# Patient Record
Sex: Male | Born: 1960 | Marital: Married | Smoking: Never smoker
Health system: Southern US, Community
[De-identification: ages and names within clinical notes are randomized; demographics above are authoritative.]

---

## 2012-01-14 ENCOUNTER — Other Ambulatory Visit: Payer: Self-pay | Admitting: Specialist

## 2012-01-14 ENCOUNTER — Ambulatory Visit
Admission: RE | Admit: 2012-01-14 | Discharge: 2012-01-14 | Disposition: A | Discharge status: Home / Self Care | Payer: Self-pay | Source: Ambulatory Visit | Attending: Specialist | Admitting: Specialist

## 2012-01-14 DIAGNOSIS — R6889 Other general symptoms and signs: Secondary | ICD-10-CM

## 2019-08-05 ENCOUNTER — Other Ambulatory Visit: Payer: Self-pay

## 2019-08-05 ENCOUNTER — Emergency Department (HOSPITAL_COMMUNITY): Payer: Self-pay

## 2019-08-05 ENCOUNTER — Emergency Department (HOSPITAL_COMMUNITY)
Admission: EM | Admit: 2019-08-05 | Discharge: 2019-08-05 | Disposition: A | Discharge status: Home / Self Care | Payer: Self-pay | Attending: Emergency Medicine | Admitting: Emergency Medicine

## 2019-08-05 ENCOUNTER — Encounter (HOSPITAL_COMMUNITY): Payer: Self-pay | Admitting: Emergency Medicine

## 2019-08-05 DIAGNOSIS — H81399 Other peripheral vertigo, unspecified ear: Secondary | ICD-10-CM | POA: Insufficient documentation

## 2019-08-05 LAB — BASIC METABOLIC PANEL
Anion gap: 13 (ref 5–15)
BUN: 13 mg/dL (ref 6–20)
CO2: 22 mmol/L (ref 22–32)
Calcium: 9.7 mg/dL (ref 8.9–10.3)
Chloride: 103 mmol/L (ref 98–111)
Creatinine, Ser: 1.01 mg/dL (ref 0.61–1.24)
GFR calc Af Amer: >60 mL/min (ref >60)
GFR calc non Af Amer: >60 mL/min (ref >60)
Glucose, Bld: 124 mg/dL — ABNORMAL HIGH (ref 70–99)
Potassium: 3.5 mmol/L (ref 3.5–5.1)
Sodium: 138 mmol/L (ref 135–145)

## 2019-08-05 LAB — TROPONIN I (HIGH SENSITIVITY)
Troponin I (High Sensitivity): 4 ng/L (ref <18)
Troponin I (High Sensitivity): 4 ng/L (ref <18)

## 2019-08-05 LAB — CBC
HCT: 47.5 % (ref 39.0–52.0)
Hemoglobin: 16.1 g/dL (ref 13.0–17.0)
MCH: 29.7 pg (ref 26.0–34.0)
MCHC: 33.9 g/dL (ref 30.0–36.0)
MCV: 87.6 fL (ref 80.0–100.0)
Platelets: 327 10*3/uL (ref 150–400)
RBC: 5.42 MIL/uL (ref 4.22–5.81)
RDW: 13.1 % (ref 11.5–15.5)
WBC: 7.6 10*3/uL (ref 4.0–10.5)
nRBC: 0 % (ref 0.0–0.2)

## 2019-08-05 LAB — URINALYSIS, ROUTINE W REFLEX MICROSCOPIC
Bilirubin Urine: NEGATIVE
Glucose, UA: NEGATIVE mg/dL
Ketones, ur: NEGATIVE mg/dL
Leukocytes,Ua: NEGATIVE
Nitrite: NEGATIVE
Protein, ur: NEGATIVE mg/dL
Specific Gravity, Urine: 1.01 (ref 1.005–1.030)
pH: 7 (ref 5.0–8.0)

## 2019-08-05 LAB — URINALYSIS, MICROSCOPIC (REFLEX)
Bacteria, UA: NONE SEEN
RBC / HPF: NONE SEEN RBC/hpf (ref 0–5)
Squamous Epithelial / HPF: NONE SEEN (ref 0–5)
WBC, UA: NONE SEEN WBC/hpf (ref 0–5)

## 2019-08-05 MED ORDER — MECLIZINE HCL 25 MG PO TABS
25.0000 mg | ORAL_TABLET | Freq: Once | ORAL | Status: AC
  Administered 2019-08-05: 25 mg via ORAL
  Filled 2019-08-05: qty 1

## 2019-08-05 MED ORDER — SODIUM CHLORIDE 0.9% FLUSH: 3.0000 mL | Freq: Once | INTRAVENOUS | Status: DC

## 2019-08-05 MED ORDER — MECLIZINE HCL 25 MG PO TABS: 25.0000 mg | ORAL_TABLET | Freq: Three times a day (TID) | ORAL | 0 refills | Status: AC | PRN

## 2019-08-05 NOTE — ED Notes (Addendum)
Pt able to ambulate unassisted, pt denies feeling dizzy or lightheaded. 98% RA while ambulating.

## 2019-08-05 NOTE — ED Notes (Signed)
Patient transported to CT 

## 2019-08-05 NOTE — ED Triage Notes (Signed)
Patient here from home with complaints of dizziness that started today. Ambulatory with assistance. Reports that it feels like the "room is spinning".

## 2019-08-05 NOTE — Discharge Instructions (Signed)
Take the prescription as directed.  Call your regular medical doctor today to schedule a follow up appointment within the next 3 days.  Call the Neurologist today to schedule a follow up appointment within the next week. Return to the Emergency Department immediately sooner if worsening.  ° °

## 2019-08-05 NOTE — ED Notes (Signed)
An After Visit Summary was printed and given to the patient. Discharge instructions given and no further questions at this time.  Pt leaving with son.  

## 2019-08-05 NOTE — ED Provider Notes (Signed)
[facility]-EMERGENCY DEPT Provider Note   CSN: [id number] Arrival date & time: 08/05/19  0404     History   Chief Complaint Chief Complaint  Patient presents with   Dizziness    HPI John Atkins is a 58 y.o. male.     HPI  Pt was seen at 0735.  Per pt and his family, c/o gradual onset and persistence of constant "dizziness" for the past 2 days. Pt describes the dizziness as "everything is spinning around." Worsens with moving his head side to side. Denies any other symptoms. Denies visual changes, no focal motor weakness, no tingling/numbness in extremities, no ataxia, no slurred speech, no facial droop. Denies CP/palpitations, no SOB/cough, no abd pain, no N/V/D, no neck pain, no rash, no injury, no fevers.    History reviewed. No pertinent past medical history.  There are no active problems to display for this patient.   History reviewed. No pertinent surgical history.      Home Medications    Prior to Admission medications   Not on File    Family History No family history on file.  Social History Social History   Tobacco Use   Smoking status: Never Smoker   Smokeless tobacco: Never Used  Substance Use Topics   Alcohol use: Never    Frequency: Never   Drug use: Never     Allergies   Patient has no allergy information on record.   Review of Systems Review of Systems ROS: Statement: All systems negative except as marked or noted in the HPI; Constitutional: Negative for fever and chills. ; ; Eyes: Negative for eye pain, redness and discharge. ; ; ENMT: Negative for ear pain, hoarseness, nasal congestion, sinus pressure and sore throat. ; ; Cardiovascular: Negative for chest pain, palpitations, diaphoresis, dyspnea and peripheral edema. ; ; Respiratory: Negative for cough, wheezing and stridor. ; ; Gastrointestinal: Negative for nausea, vomiting, diarrhea, abdominal pain, blood in stool, hematemesis, jaundice and rectal bleeding.  . ; ; Genitourinary: Negative for dysuria, flank pain and hematuria. ; ; Musculoskeletal: Negative for back pain and neck pain. Negative for swelling and trauma.; ; Skin: Negative for pruritus, rash, abrasions, blisters, bruising and skin lesion.; ; Neuro: +"everything is spinning." Negative for headache, lightheadedness and neck stiffness. Negative for weakness, altered level of consciousness, altered mental status, extremity weakness, paresthesias, involuntary movement, seizure and syncope.       Physical Exam Updated Vital Signs BP (!) 208/88 (BP Location: Left Arm)    Pulse 82    Temp 98.4 F (36.9 C) (Oral)    Resp 17    SpO2 100%    Patient Vitals for the past 24 hrs:  BP Temp Temp src Pulse Resp SpO2  08/05/19 1200 (!) 155/95 -- -- 78 15 98 %  08/05/19 1130 (!) 177/99 -- -- 96 (!) 25 99 %  08/05/19 1100 (!) 166/81 -- -- 86 19 98 %  08/05/19 1030 (!) 171/98 -- -- 89 17 98 %  08/05/19 1015 -- -- -- 86 16 98 %  08/05/19 1001 (!) 192/87 -- -- 85 16 98 %  08/05/19 1000 (!) 192/87 -- -- 74 13 97 %  08/05/19 0843 (!) 191/90 -- -- 87 16 98 %  08/05/19 0559 (!) 208/88 -- -- 82 17 100 %  08/05/19 0558 (!) 194/89 98.4 F (36.9 C) Oral 85 (!) 21 100 %     10:21 Orthostatic Vital Signs JS  Orthostatic Sitting  BP- Sitting: 192/87  Pulse-  Sitting: 79      Orthostatic Standing at 0 minutes  BP- Standing at 0 minutes: 199/91Abnormal   Pulse- Standing at 0 minutes: 80     Physical Exam 0740: Physical examination:  Nursing notes reviewed; Vital signs and O2 SAT reviewed;  Constitutional: Well developed, Well nourished, Well hydrated, In no acute distress; Head:  Normocephalic, atraumatic; Eyes: EOMI, PERRL, No scleral icterus; ENMT: Clear fluid behind TM's bilat,no erythema or purulence. +edemetous nasal turbinates bilat with clear rhinorrhea. Mouth and pharynx normal, Mucous membranes moist; Neck: Supple, Full range of motion, No lymphadenopathy; Cardiovascular: Regular rate and rhythm,  No gallop; Respiratory: Breath sounds clear & equal bilaterally, No wheezes.  Speaking full sentences with ease, Normal respiratory effort/excursion; Chest: Nontender, Movement normal; Abdomen: Soft, Nontender, Nondistended, Normal bowel sounds; Genitourinary: No CVA tenderness; Extremities: Peripheral pulses normal, No tenderness, No edema, No calf edema or asymmetry.; Neuro: AA&Ox3, Major CN grossly intact. Speech clear.  No facial droop. +right horizontal end gaze fatigable nystagmus. Grips equal. Strength 5/5 equal bilat UE's and LE's.  DTR 2/4 equal bilat UE's and LE's.  No gross sensory deficits.  Normal cerebellar testing bilat UE's (finger-nose) and LE's (heel-shin)..; Skin: Color normal, Warm, Dry.   ED Treatments / Results  Labs (all labs ordered are listed, but only abnormal results are displayed)   EKG EKG Interpretation  Date/Time:  Thursday August 05 2019 05:58:06 EDT Ventricular Rate:  84 PR Interval:    QRS Duration: 84 QT Interval:  396 QTC Calculation: 469 R Axis:   17 Text Interpretation:  Sinus rhythm No old tracing to compare Confirmed by Pryor Curia [phone]) on 08/05/2019 6:02:32 AM   Radiology   Procedures Procedures (including critical care time)  Medications Ordered in ED Medications  sodium chloride flush (NS) 0.9 % injection 3 mL (has no administration in time range)  meclizine (ANTIVERT) tablet 25 mg (has no administration in time range)     Initial Impression / Assessment and Plan / ED Course  I have reviewed the triage vital signs and the nursing notes.  Pertinent labs & imaging results that were available during my care of the patient were reviewed by me and considered in my medical decision making (see chart for details).     MDM Reviewed: previous chart, nursing note and vitals Reviewed previous: labs Interpretation: labs and CT scan     Results for orders placed or performed during the hospital encounter of 60/45/40  Basic  metabolic panel  Result Value Ref Range   Sodium 138 135 - 145 mmol/L   Potassium 3.5 3.5 - 5.1 mmol/L   Chloride 103 98 - 111 mmol/L   CO2 22 22 - 32 mmol/L   Glucose, Bld 124 (H) 70 - 99 mg/dL   BUN 13 6 - 20 mg/dL   Creatinine, Ser 1.01 0.61 - 1.24 mg/dL   Calcium 9.7 8.9 - 10.3 mg/dL   GFR calc non Af Amer >60 >60 mL/min   GFR calc Af Amer >60 >60 mL/min   Anion gap 13 5 - 15  CBC  Result Value Ref Range   WBC 7.6 4.0 - 10.5 K/uL   RBC 5.42 4.22 - 5.81 MIL/uL   Hemoglobin 16.1 13.0 - 17.0 g/dL   HCT 47.5 39.0 - 52.0 %   MCV 87.6 80.0 - 100.0 fL   MCH 29.7 26.0 - 34.0 pg   MCHC 33.9 30.0 - 36.0 g/dL   RDW 13.1 11.5 - 15.5 %   Platelets 327 150 -  400 K/uL   nRBC 0.0 0.0 - 0.2 %  Urinalysis, Routine w reflex microscopic  Result Value Ref Range   Color, Urine STRAW (A) YELLOW   APPearance CLEAR CLEAR   Specific Gravity, Urine 1.010 1.005 - 1.030   pH 7.0 5.0 - 8.0   Glucose, UA NEGATIVE NEGATIVE mg/dL   Hgb urine dipstick TRACE (A) NEGATIVE   Bilirubin Urine NEGATIVE NEGATIVE   Ketones, ur NEGATIVE NEGATIVE mg/dL   Protein, ur NEGATIVE NEGATIVE mg/dL   Nitrite NEGATIVE NEGATIVE   Leukocytes,Ua NEGATIVE NEGATIVE  Urinalysis, Microscopic (reflex)  Result Value Ref Range   RBC / HPF NONE SEEN 0 - 5 RBC/hpf   WBC, UA NONE SEEN 0 - 5 WBC/hpf   Bacteria, UA NONE SEEN NONE SEEN   Squamous Epithelial / LPF NONE SEEN 0 - 5  Troponin I (High Sensitivity)  Result Value Ref Range   Troponin I (High Sensitivity) 4 <18 ng/L  Troponin I (High Sensitivity)  Result Value Ref Range   Troponin I (High Sensitivity) 4 <18 ng/L   Ct Head Wo Contrast Result Date: 08/05/2019 CLINICAL DATA:  Vertigo, dizziness EXAM: CT HEAD WITHOUT CONTRAST TECHNIQUE: Contiguous axial images were obtained from the base of the skull through the vertex without intravenous contrast. COMPARISON:  None. FINDINGS: Brain: No evidence of acute infarction, hemorrhage, hydrocephalus, extra-axial collection or mass  lesion/mass effect. Vascular: No hyperdense vessel or unexpected calcification. Skull: Normal. Negative for fracture or focal lesion. Sinuses/Orbits: No acute finding. Other: None. IMPRESSION: No acute intracranial findings. Electronically Signed   By: Duanne GuessNicholas  Plundo M.D.   On: 08/05/2019 08:35    John Atkins was evaluated in Emergency Department on 08/05/2019 for the symptoms described in the history of present illness. He was evaluated in the context of the global COVID-19 pandemic, which necessitated consideration that the patient might be at risk for infection with the SARS-CoV-2 virus that causes COVID-19. Institutional protocols and algorithms that pertain to the evaluation of patients at risk for COVID-19 are in a state of rapid change based on information released by regulatory bodies including the CDC and federal and state organizations. These policies and algorithms were followed during the patient's care in the ED.   1245:  Pt feels better after meds and wants to go home now. Pt has tol PO well without N/V. Pt not orthostatic on VS. Pt ambulated with steady gait, easy resps, NAD, Sats 98% R/A. Pt denies any further symptoms. Tx symptomatically at this time. Dx and testing, as well as incidental finding(s), d/w pt and family.  Questions answered.  Verb understanding, agreeable to d/c home with outpt f/u.    Final Clinical Impressions(s) / ED Diagnoses   Final diagnoses:  None    ED Discharge Orders    None       Samuel JesterMcManus, Dontarius Sheley, DO 08/09/19 1701

## 2021-01-12 IMAGING — CT CT HEAD W/O CM
3 series · 16 of 46 positions shown, 19 images · non-contrast
Comparison: None.

CLINICAL DATA: Vertigo, dizziness

EXAM:
CT HEAD WITHOUT CONTRAST
TECHNIQUE: Contiguous axial images were obtained from the base of the skull
through the vertex without intravenous contrast.

[Series 2: head wo · axial · 0.47mm/px · z∈[-107,+13]mm · 10 of 29 slices shown, 13 images]
[im 3/29  brain]
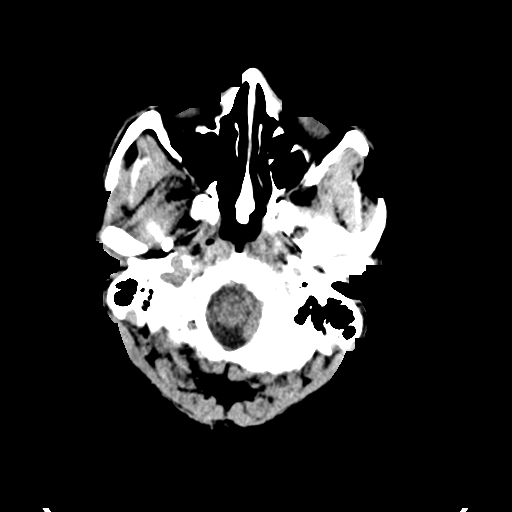
[im 3/29  bone]
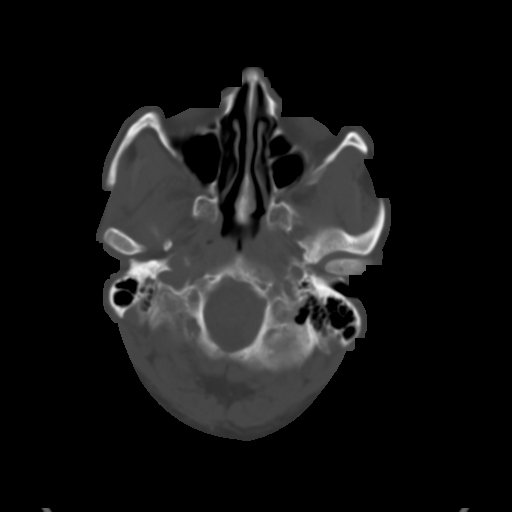
[im 6/29  brain]
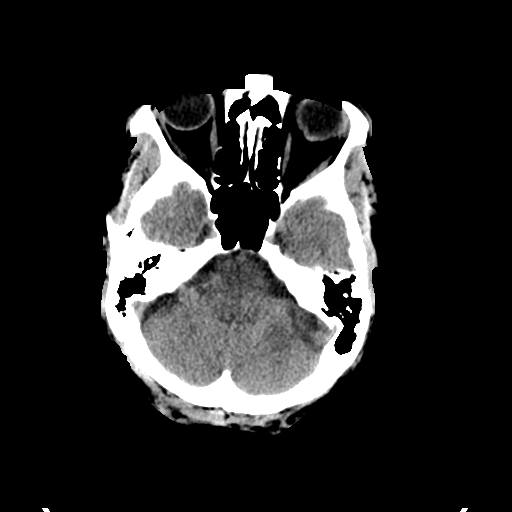
[im 8/29  brain]
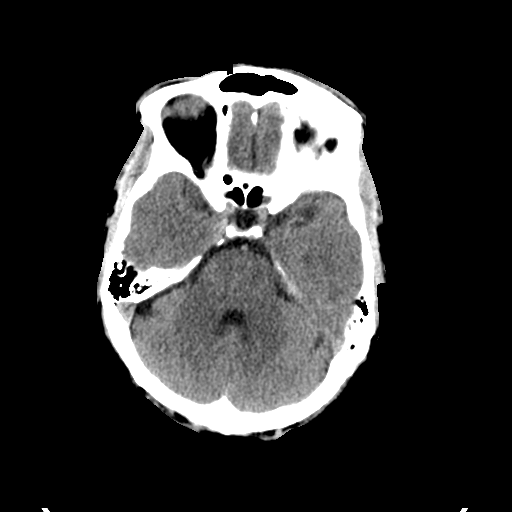
[im 11/29  brain]
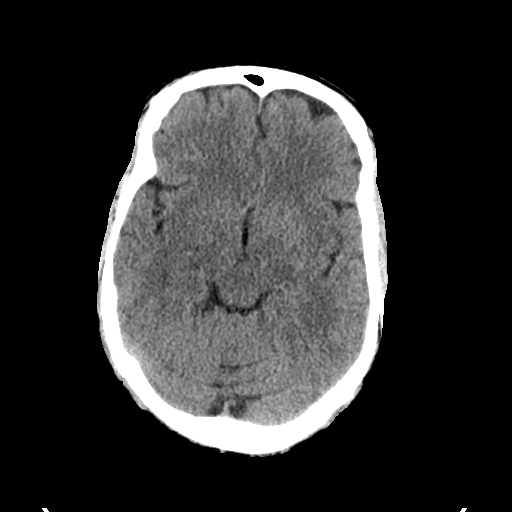
[im 14/29  brain]
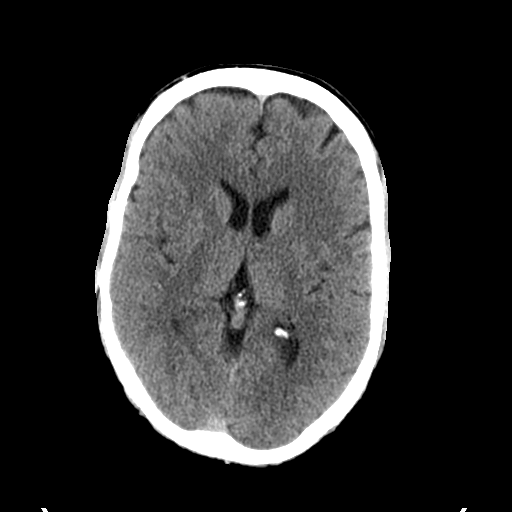
[im 14/29  bone]
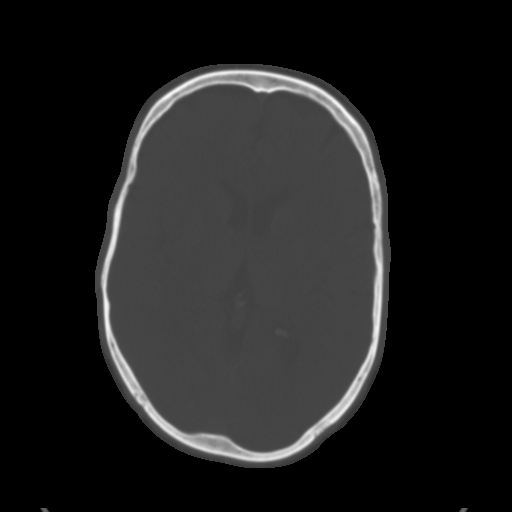
[im 16/29  brain]
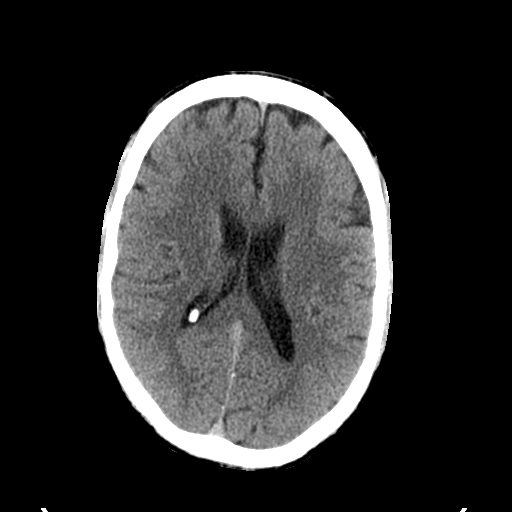
[im 19/29  brain]
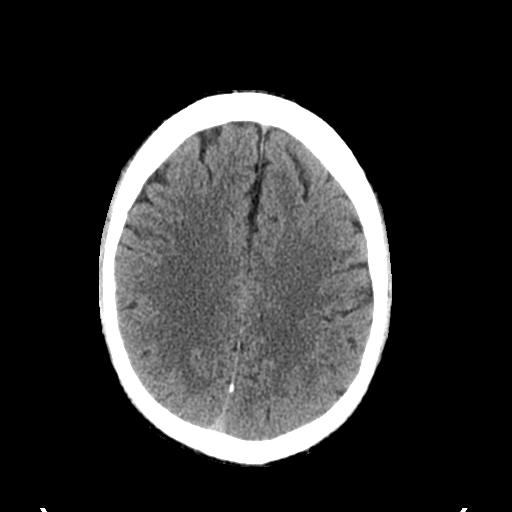
[im 22/29  brain]
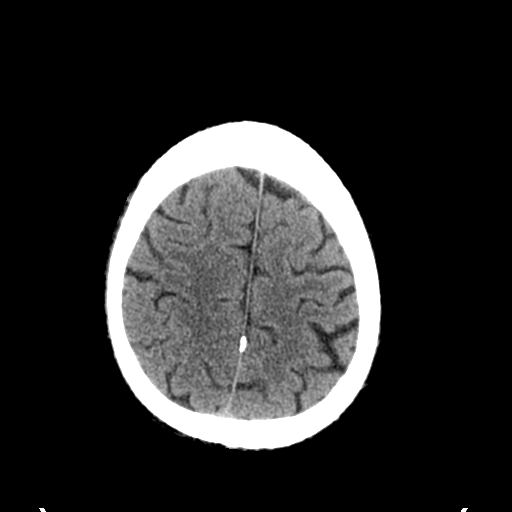
[im 24/29  brain]
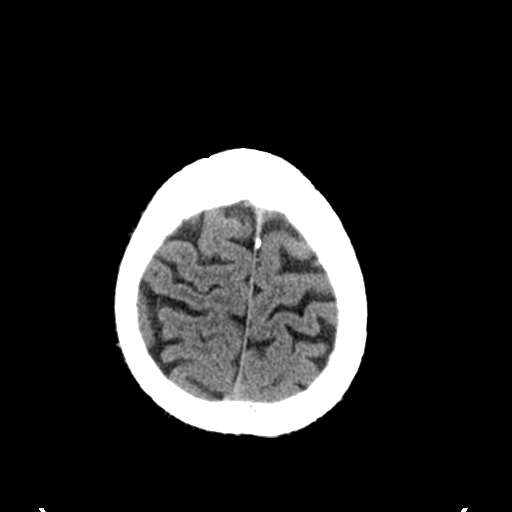
[im 24/29  bone]
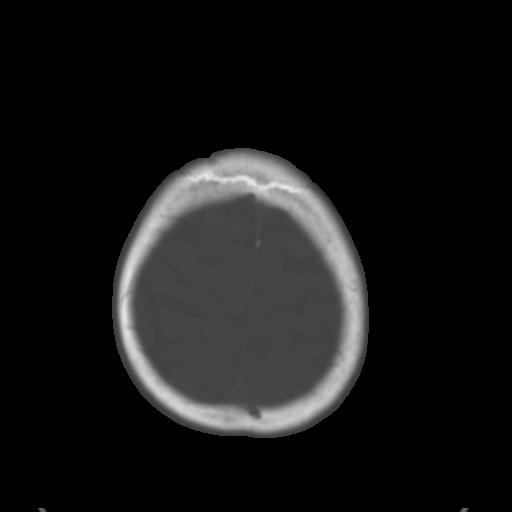
[im 27/29  brain]
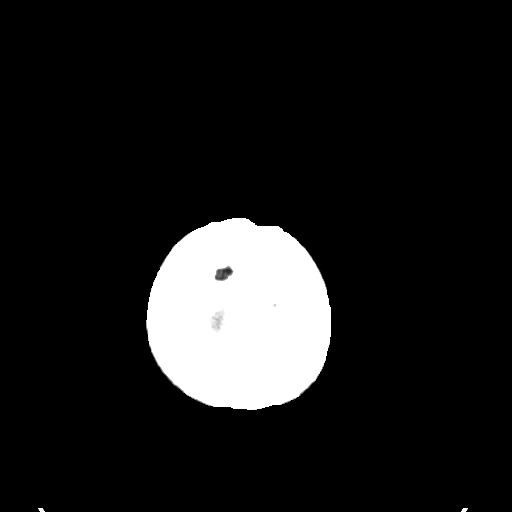

[Series 4: coronal soft tissue · coronal · 0.28mm/px · 3 of 64 slices shown]
[im 22/64  brain]
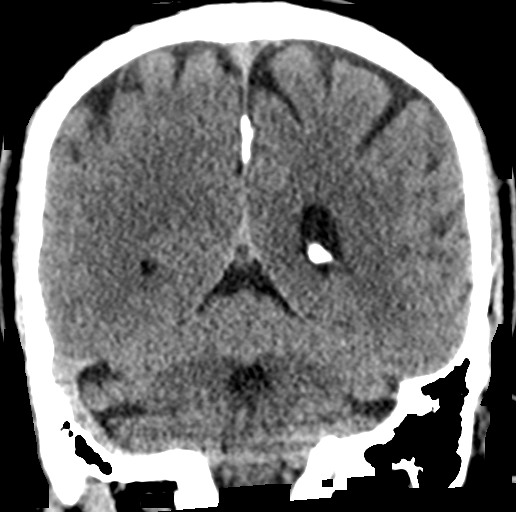
[im 29/64  brain]
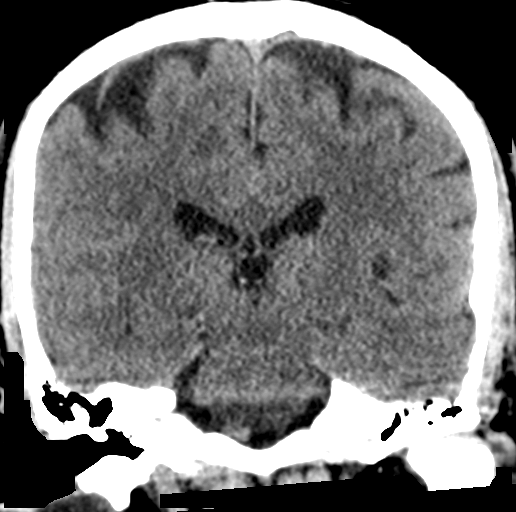
[im 36/64  brain]
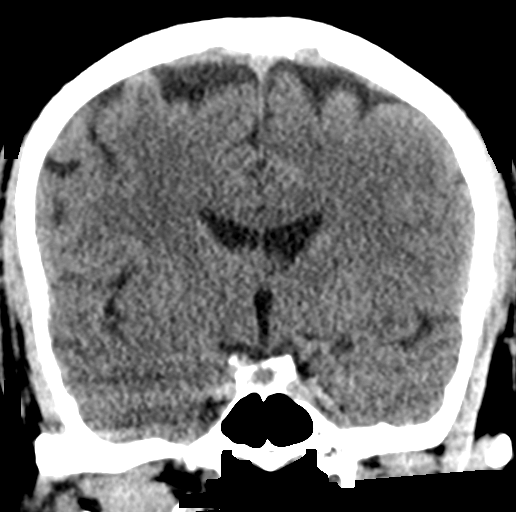

[Series 5: sagittal soft tissue · sagittal · 0.28mm/px · 3 of 51 slices shown]
[im 17/51  brain]
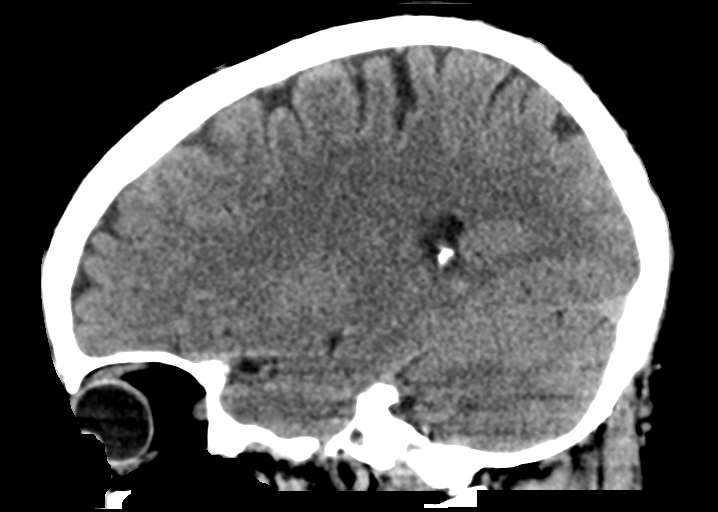
[im 26/51  brain]
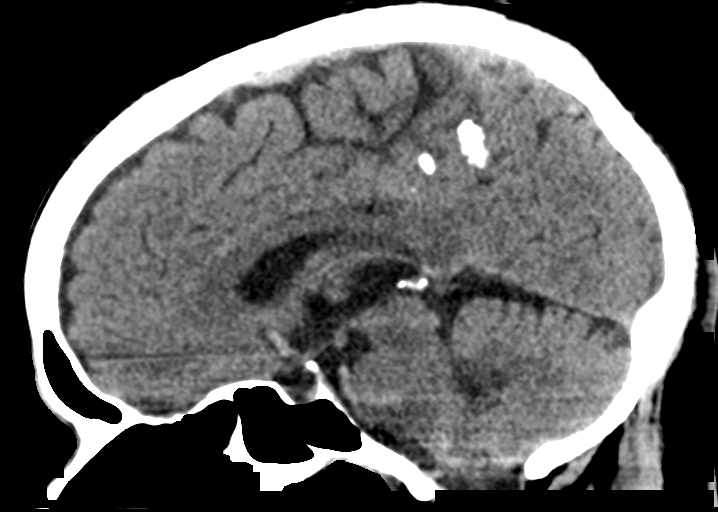
[im 34/51  brain]
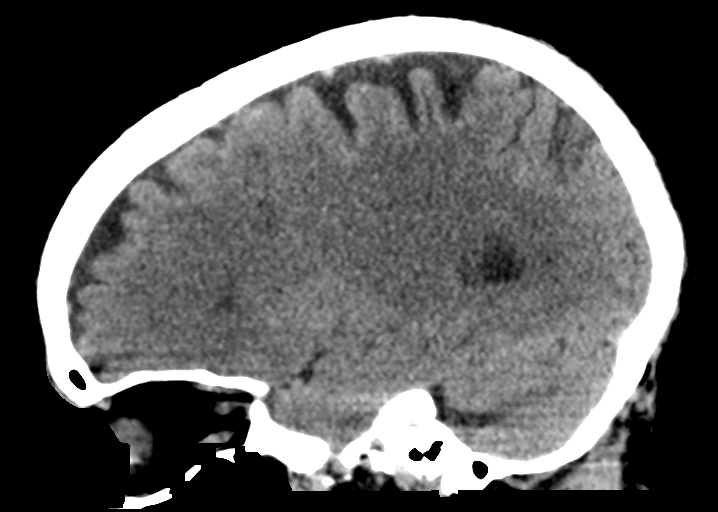

[16 of 46 positions shown; findings below may reference images not displayed]

FINDINGS: Brain: No evidence of acute infarction, hemorrhage, hydrocephalus,
extra-axial collection or mass lesion/mass effect.

Vascular: No hyperdense vessel or unexpected calcification.

Skull: Normal. Negative for fracture or focal lesion.

Sinuses/Orbits: No acute finding.

Other: None.
IMPRESSION: No acute intracranial findings.
